# Patient Record
Sex: Female | Born: 2000 | Race: Black or African American | Hispanic: No | Marital: Single | Smoking: Former smoker
Health system: Southern US, Community
[De-identification: ages and names within clinical notes are randomized; demographics above are authoritative.]

## PROBLEM LIST (undated history)

## (undated) DIAGNOSIS — J45909 Unspecified asthma, uncomplicated: Secondary | ICD-10-CM

## (undated) DIAGNOSIS — F419 Anxiety disorder, unspecified: Secondary | ICD-10-CM

## (undated) HISTORY — PX: TONSILLECTOMY: SUR1361

## (undated) HISTORY — PX: ADENOIDECTOMY: SUR15

---
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
## 2021-05-16 ENCOUNTER — Other Ambulatory Visit: Payer: Self-pay

## 2021-05-16 ENCOUNTER — Emergency Department (HOSPITAL_COMMUNITY)
Admission: EM | Admit: 2021-05-16 | Discharge: 2021-05-16 | Disposition: A | Discharge status: Home / Self Care | Payer: Medicaid Other | Attending: Emergency Medicine | Admitting: Emergency Medicine

## 2021-05-16 ENCOUNTER — Emergency Department (HOSPITAL_COMMUNITY): Payer: Medicaid Other

## 2021-05-16 ENCOUNTER — Encounter (HOSPITAL_COMMUNITY): Payer: Self-pay

## 2021-05-16 DIAGNOSIS — S299XXA Unspecified injury of thorax, initial encounter: Secondary | ICD-10-CM

## 2021-05-16 DIAGNOSIS — S20212A Contusion of left front wall of thorax, initial encounter: Secondary | ICD-10-CM | POA: Insufficient documentation

## 2021-05-16 DIAGNOSIS — Y92410 Unspecified street and highway as the place of occurrence of the external cause: Secondary | ICD-10-CM | POA: Diagnosis not present

## 2021-05-16 DIAGNOSIS — J45909 Unspecified asthma, uncomplicated: Secondary | ICD-10-CM | POA: Insufficient documentation

## 2021-05-16 DIAGNOSIS — R1030 Lower abdominal pain, unspecified: Secondary | ICD-10-CM | POA: Diagnosis not present

## 2021-05-16 DIAGNOSIS — Z7951 Long term (current) use of inhaled steroids: Secondary | ICD-10-CM | POA: Insufficient documentation

## 2021-05-16 LAB — I-STAT CHEM 8, ED
BUN: 5 mg/dL — ABNORMAL LOW (ref 6–20)
Calcium, Ion: 1.19 mmol/L (ref 1.15–1.40)
Chloride: 103 mmol/L (ref 98–111)
Creatinine, Ser: 0.6 mg/dL (ref 0.44–1.00)
Glucose, Bld: 91 mg/dL (ref 70–99)
HCT: 37 % (ref 36.0–46.0)
Hemoglobin: 12.6 g/dL (ref 12.0–15.0)
Potassium: 3.8 mmol/L (ref 3.5–5.1)
Sodium: 138 mmol/L (ref 135–145)
TCO2: 28 mmol/L (ref 22–32)

## 2021-05-16 LAB — BASIC METABOLIC PANEL
Anion gap: 10 (ref 5–15)
BUN: 6 mg/dL (ref 6–20)
CO2: 23 mmol/L (ref 22–32)
Calcium: 9.5 mg/dL (ref 8.9–10.3)
Chloride: 102 mmol/L (ref 98–111)
Creatinine, Ser: 0.75 mg/dL (ref 0.44–1.00)
GFR, Estimated: >60 mL/min (ref >60)
Glucose, Bld: 94 mg/dL (ref 70–99)
Potassium: 3.6 mmol/L (ref 3.5–5.1)
Sodium: 135 mmol/L (ref 135–145)

## 2021-05-16 LAB — TYPE AND SCREEN
ABO/RH(D): O POS
Antibody Screen: NEGATIVE

## 2021-05-16 LAB — CBC WITH DIFFERENTIAL/PLATELET
Abs Immature Granulocytes: 0.28 10*3/uL — ABNORMAL HIGH (ref 0.00–0.07)
Basophils Absolute: 0 10*3/uL (ref 0.0–0.1)
Basophils Relative: 0 %
Eosinophils Absolute: 0 10*3/uL (ref 0.0–0.5)
Eosinophils Relative: 0 %
HCT: 34.9 % — ABNORMAL LOW (ref 36.0–46.0)
Hemoglobin: 11.9 g/dL — ABNORMAL LOW (ref 12.0–15.0)
Immature Granulocytes: 2 %
Lymphocytes Relative: 8 %
Lymphs Abs: 1.3 10*3/uL (ref 0.7–4.0)
MCH: 31.1 pg (ref 26.0–34.0)
MCHC: 34.1 g/dL (ref 30.0–36.0)
MCV: 91.1 fL (ref 80.0–100.0)
Monocytes Absolute: 1.1 10*3/uL — ABNORMAL HIGH (ref 0.1–1.0)
Monocytes Relative: 7 %
Neutro Abs: 13.4 10*3/uL — ABNORMAL HIGH (ref 1.7–7.7)
Neutrophils Relative %: 83 %
Platelets: 445 10*3/uL — ABNORMAL HIGH (ref 150–400)
RBC: 3.83 MIL/uL — ABNORMAL LOW (ref 3.87–5.11)
RDW: 12.8 % (ref 11.5–15.5)
WBC: 16.1 10*3/uL — ABNORMAL HIGH (ref 4.0–10.5)
nRBC: 0 % (ref 0.0–0.2)

## 2021-05-16 LAB — I-STAT BETA HCG BLOOD, ED (MC, WL, AP ONLY): I-stat hCG, quantitative: 9.7 m[IU]/mL — ABNORMAL HIGH (ref <5)

## 2021-05-16 MED ORDER — SODIUM CHLORIDE 0.9 % IV BOLUS: 1000.0000 mL | Freq: Once | INTRAVENOUS | Status: DC

## 2021-05-16 MED ORDER — IOHEXOL 300 MG/ML  SOLN
100.0000 mL | Freq: Once | INTRAMUSCULAR | Status: AC | PRN
  Administered 2021-05-16: 100 mL via INTRAVENOUS

## 2021-05-16 MED ORDER — MORPHINE SULFATE (PF) 2 MG/ML IV SOLN
2.0000 mg | Freq: Once | INTRAVENOUS | Status: AC
  Administered 2021-05-16: 2 mg via INTRAVENOUS
  Filled 2021-05-16: qty 1

## 2021-05-16 MED ORDER — SODIUM CHLORIDE 0.9 % IV BOLUS
1000.0000 mL | Freq: Once | INTRAVENOUS | Status: AC
  Administered 2021-05-16: 1000 mL via INTRAVENOUS
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
## 2021-05-16 NOTE — Discharge Instructions (Addendum)
Return for any problem - especially increased pain, vomiting, or other emergency.  Take ibupofen - 600 mg every 8 hours - for pain.
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
## 2021-05-16 NOTE — ED Notes (Signed)
Discharge instructions reviewed with patient and family. Patient and family verbalized understanding of instructions. Follow-up care and medications were reviewed. Patient wheeled out of ED in wheelchair. VSS upon discharge.  °

## 2021-06-11 NOTE — L&D Delivery Note (Signed)
OB/GYN Faculty Practice Delivery Note  Debbie Schaefer is a 21 y.o. G1P0 s/p SVD at [redacted]w[redacted]d. She was admitted for SOL.   ROM: Uknown possibly in MAU. Clear fluid at last cervical exam when complete GBS Status: Positive, Vanc 2/2 PNC rxn   Maximum Maternal Temperature: 98.3  Labor Progress: Initial SVE: 5/80/-2. She then progressed to complete.   Delivery Date/Time: 05/04/2022 @0155  Delivery: Called to room and patient was complete and pushing. Head delivered OA. No nuchal cord present. Shoulder and body delivered in usual fashion. Infant with spontaneous cry, placed on mother's abdomen, dried and stimulated. Cord clamped x 2 after 1-minute delay, and cut by FOB. Cord blood drawn. Placenta delivered spontaneously with gentle cord traction. Fundus firm with massage and Pitocin. Labia, perineum, vagina, and cervix inspected with left perineal sulcus tear and left skin abrasion repaired with 3.0 vicryl and 4.0 Monocryl respectively.  Baby Weight: pending  Placenta: 3 vessel, intact. Sent to L&D Complications: None Lacerations: left perineal sulcus tear and left skin abrasion repaired with 3.0 vicryl and 4.0 Monocryl respectively.  EBL: 47 mL Analgesia: Epidural   Infant:  APGAR (1 MIN): 7   APGAR (5 MINS): 8    , DO OB Fellow, Faculty Lavonda Jumbo, [facility] 05/04/2022, 2:26 AM

## 2021-06-26 ENCOUNTER — Emergency Department (HOSPITAL_COMMUNITY)
Admission: EM | Admit: 2021-06-26 | Discharge: 2021-06-26 | Disposition: A | Discharge status: Home / Self Care | Payer: Medicaid Other | Attending: Emergency Medicine | Admitting: Emergency Medicine

## 2021-06-26 ENCOUNTER — Encounter (HOSPITAL_COMMUNITY): Payer: Self-pay

## 2021-06-26 DIAGNOSIS — Z3A09 9 weeks gestation of pregnancy: Secondary | ICD-10-CM | POA: Diagnosis not present

## 2021-06-26 DIAGNOSIS — O200 Threatened abortion: Secondary | ICD-10-CM | POA: Diagnosis not present

## 2021-06-26 DIAGNOSIS — O4691 Antepartum hemorrhage, unspecified, first trimester: Secondary | ICD-10-CM | POA: Diagnosis present

## 2021-06-26 LAB — CBC
HCT: 33.6 % — ABNORMAL LOW (ref 36.0–46.0)
Hemoglobin: 11.3 g/dL — ABNORMAL LOW (ref 12.0–15.0)
MCH: 30.7 pg (ref 26.0–34.0)
MCHC: 33.6 g/dL (ref 30.0–36.0)
MCV: 91.3 fL (ref 80.0–100.0)
Platelets: 312 10*3/uL (ref 150–400)
RBC: 3.68 MIL/uL — ABNORMAL LOW (ref 3.87–5.11)
RDW: 13.2 % (ref 11.5–15.5)
WBC: 6.4 10*3/uL (ref 4.0–10.5)
nRBC: 0 % (ref 0.0–0.2)

## 2021-06-26 LAB — BASIC METABOLIC PANEL
Anion gap: 6 (ref 5–15)
BUN: 14 mg/dL (ref 6–20)
CO2: 24 mmol/L (ref 22–32)
Calcium: 8.9 mg/dL (ref 8.9–10.3)
Chloride: 107 mmol/L (ref 98–111)
Creatinine, Ser: 0.65 mg/dL (ref 0.44–1.00)
GFR, Estimated: >60 mL/min (ref >60)
Glucose, Bld: 97 mg/dL (ref 70–99)
Potassium: 3.7 mmol/L (ref 3.5–5.1)
Sodium: 137 mmol/L (ref 135–145)

## 2021-06-26 LAB — I-STAT BETA HCG BLOOD, ED (MC, WL, AP ONLY): I-stat hCG, quantitative: 25.3 m[IU]/mL — ABNORMAL HIGH (ref <5)

## 2021-06-26 LAB — HCG, QUANTITATIVE, PREGNANCY: hCG, Beta Chain, Quant, S: 19 m[IU]/mL — ABNORMAL HIGH (ref <5)

## 2021-06-26 NOTE — ED Provider Notes (Signed)
[facility] Provider Note   CSN: [id number] Arrival date & time: 06/26/21  1142     History  Chief Complaint  Patient presents with   Vaginal Bleeding    Debbie Schaefer is a 21 y.o. female.  She is a G1P0, 0 positive whose last menstrual period was 11/9.  She had 2 positive home pregnancy test.  Today she had some low abdominal cramping and noticed some dark blood.  Pain is mostly resolved and is having minimal bleeding.  She has a appointment with her OB in 2 days.  The history is provided by the patient.  Vaginal Bleeding Quality:  Dark red Severity:  Mild Onset quality:  Gradual Duration:  8 hours Timing:  Intermittent Progression:  Improving Chronicity:  New Possible pregnancy: yes   Context: spontaneously   Relieved by:  None tried Worsened by:  Nothing Ineffective treatments:  None tried Associated symptoms: abdominal pain   Associated symptoms: no back pain, no dysuria, no fever and no nausea   Risk factors: no prior miscarriage       Home Medications Prior to Admission medications   Medication Sig Start Date End Date Taking? Authorizing Provider  Biotin 10 MG CHEW Chew 2 tablets by mouth daily.    [provider]  cephALEXin (KEFLEX) 500 MG capsule Take 1 capsule (500 mg total) by mouth 3 (three) times daily. 04/27/20   Terrilee Files, MD  escitalopram (LEXAPRO) 10 MG tablet Take 10 mg by mouth daily. 05/15/21   [provider]  Norgestimate-Ethinyl Estradiol Triphasic 0.18/0.215/0.25 MG-35 MCG tablet Take 1 tablet by mouth daily.    [provider]  ondansetron (ZOFRAN ODT) 4 MG disintegrating tablet Take 1 tablet (4 mg total) by mouth every 8 (eight) hours as needed for nausea or vomiting. Patient not taking: Reported on 05/16/2021 04/27/20   Terrilee Files, MD      Allergies    Amoxicillin and Azithromycin    Review of Systems   Review of Systems  Constitutional:  Negative for fever.   HENT:  Negative for sore throat.   Respiratory:  Negative for shortness of breath.   Cardiovascular:  Negative for chest pain.  Gastrointestinal:  Positive for abdominal pain. Negative for nausea.  Genitourinary:  Positive for vaginal bleeding. Negative for dysuria.  Musculoskeletal:  Negative for back pain.  Skin:  Negative for rash.   Physical Exam Updated Vital Signs BP 120/67    Pulse 71    Temp 98.2 F (36.8 C) (Oral)    Resp 16    LMP 04/19/2021 (Exact Date)    SpO2 100%  Physical Exam Vitals and nursing note reviewed.  Constitutional:      General: She is not in acute distress.    Appearance: Normal appearance. She is well-developed.  HENT:     Head: Normocephalic and atraumatic.  Eyes:     Conjunctiva/sclera: Conjunctivae normal.  Cardiovascular:     Rate and Rhythm: Normal rate and regular rhythm.     Heart sounds: No murmur heard. Pulmonary:     Effort: Pulmonary effort is normal. No respiratory distress.     Breath sounds: Normal breath sounds.  Abdominal:     Palpations: Abdomen is soft.     Tenderness: There is no abdominal tenderness. There is no guarding or rebound.  Musculoskeletal:        General: No swelling.     Cervical back: Neck supple.  Skin:    General: Skin  is warm and dry.     Capillary Refill: Capillary refill takes less than 2 seconds.  Neurological:     General: No focal deficit present.     Mental Status: She is alert.    ED Results / Procedures / Treatments   Labs (all labs ordered are listed, but only abnormal results are displayed) Labs Reviewed  HCG, QUANTITATIVE, PREGNANCY - Abnormal; Notable for the following components:      Result Value   hCG, Beta Chain, Quant, S 19 (*)    All other components within normal limits  CBC - Abnormal; Notable for the following components:   RBC 3.68 (*)    Hemoglobin 11.3 (*)    HCT 33.6 (*)    All other components within normal limits  I-STAT BETA HCG BLOOD, ED ([facility], [facility], [facility] ONLY) - Abnormal;  Notable for the following components:   I-stat hCG, quantitative 25.3 (*)    All other components within normal limits  BASIC METABOLIC PANEL    EKG None  Radiology No results found.  Procedures Procedures    Medications Ordered in ED Medications - No data to display  ED Course/ Medical Decision Making/ A&P Clinical Course as of 06/27/21 1053  Mon Jun 26, 2021  1744 HCG, Beta Chain, Quant, S(!): 19 [JL]  1817 Reviewed the results of the work-up with the patient and the expected course.  She has an appointment with OB in 2 days where they can repeat her quant.  She declines pelvic exam at this time. [MB]    Clinical Course User Index [JL] Ernie Avena, MD [MB] Terrilee Files, MD                           Medical Decision Making  This patient complains of vaginal bleeding and lower abdominal cramping in the setting of early pregnancy; this involves an extensive number of treatment Options and is a complaint that carries with it a high risk of complications and Morbidity. The differential includes miscarriage, ectopic, UTI, pregnancy  I ordered, reviewed and interpreted labs, which included CBC with normal white count, hemoglobin low stable from priors, chemistries normal, beta quant low for dates  Additional history obtained from patient significant other Previous records obtained and reviewed in epic no recent admissions, is  O positive blood type  After the interventions stated above, I reevaluated the patient and found patient to be hemodynamically stable.  Reviewed results of work-up with her.  She understands she needs OB follow-up for serial quant and ultrasound.  Return instructions discussed         Final Clinical Impression(s) / ED Diagnoses Final diagnoses:  Threatened miscarriage    Rx / DC Orders ED Discharge Orders     None         Terrilee Files, MD 06/27/21 1055

## 2021-06-26 NOTE — ED Provider Triage Note (Signed)
Emergency Medicine Provider Triage Evaluation Note  Debbie Schaefer , a 21 y.o. female  was evaluated in triage.  Pt complains of abdominal cramping that occurred this morning lasted 15 minutes and then resolved and since then she has had a dark brown discharge that has persisted.  She denies back pain, abdominal pain.  Without other complaints.  Reports she took a home pregnancy test Tuesday and was positive.  Last menstrual period November 9..  Review of Systems  Positive: As above Negative: As above  Physical Exam  BP 127/80 (BP Location: Left Arm)    Pulse 78    Temp 98.2 F (36.8 C) (Oral)    Resp 16    LMP 04/19/2021 (Exact Date)    SpO2 100%  Gen:   Awake, no distress   Resp:  Normal effort  MSK:   Moves extremities without difficulty  Other:    Medical Decision Making  Medically screening exam initiated at 12:38 PM.  Appropriate orders placed.  Debbie Schaefer was informed that the remainder of the evaluation will be completed by another provider, this initial triage assessment does not replace that evaluation, and the importance of remaining in the ED until their evaluation is complete.     Marita [place], PA-C 06/26/21 1239

## 2021-06-26 NOTE — ED Notes (Signed)
Pelvic cart at bedside. 

## 2021-06-26 NOTE — Discharge Instructions (Addendum)
You were seen in the emergency department for lower abdominal crampy pain and bleeding in the setting of an early pregnancy.  Your pregnancy number is 19, which does not fit with a 6 to 8-week pregnancy.  This may represent an early pregnancy and your dates are off, or this may be indicating a spontaneous miscarriage.  It would be important for you to follow-up with your OB so they can repeat lab work and possibly do an ultrasound.  You can use Tylenol for pain.  Return to the emergency department if any worsening or concerning symptoms.

## 2021-06-26 NOTE — ED Triage Notes (Signed)
Pt reports she did an at home preg test and was positive last Tuesday.   Pt c/o lower abd cramping with onset this morning. Pt reports 2 hours later pt started bleeding dark brown spotting about 1/4 of the pad.   LMP-November 9th   A/Ox4 Ambulatory in triage

## 2021-09-21 LAB — OB RESULTS CONSOLE RUBELLA ANTIBODY, IGM: Rubella: IMMUNE

## 2022-02-13 LAB — OB RESULTS CONSOLE HIV ANTIBODY (ROUTINE TESTING): HIV: NONREACTIVE

## 2022-04-04 ENCOUNTER — Other Ambulatory Visit: Payer: Self-pay

## 2022-04-04 ENCOUNTER — Inpatient Hospital Stay (HOSPITAL_BASED_OUTPATIENT_CLINIC_OR_DEPARTMENT_OTHER): Payer: Medicaid Other

## 2022-04-04 ENCOUNTER — Encounter (HOSPITAL_COMMUNITY): Payer: Self-pay | Admitting: Obstetrics and Gynecology

## 2022-04-04 ENCOUNTER — Inpatient Hospital Stay (HOSPITAL_COMMUNITY)
Admission: AD | Admit: 2022-04-04 | Discharge: 2022-04-04 | Disposition: A | Discharge status: Home / Self Care | Payer: Medicaid Other | Attending: Obstetrics and Gynecology | Admitting: Obstetrics and Gynecology

## 2022-04-04 DIAGNOSIS — W010XXA Fall on same level from slipping, tripping and stumbling without subsequent striking against object, initial encounter: Secondary | ICD-10-CM | POA: Insufficient documentation

## 2022-04-04 DIAGNOSIS — Z3A34 34 weeks gestation of pregnancy: Secondary | ICD-10-CM | POA: Insufficient documentation

## 2022-04-04 DIAGNOSIS — X58XXXA Exposure to other specified factors, initial encounter: Secondary | ICD-10-CM | POA: Insufficient documentation

## 2022-04-04 DIAGNOSIS — W19XXXA Unspecified fall, initial encounter: Secondary | ICD-10-CM | POA: Diagnosis not present

## 2022-04-04 DIAGNOSIS — O9A213 Injury, poisoning and certain other consequences of external causes complicating pregnancy, third trimester: Secondary | ICD-10-CM | POA: Diagnosis not present

## 2022-04-04 DIAGNOSIS — O26893 Other specified pregnancy related conditions, third trimester: Secondary | ICD-10-CM | POA: Insufficient documentation

## 2022-04-04 LAB — OB RESULTS CONSOLE GC/CHLAMYDIA: Neisseria Gonorrhea: NEGATIVE

## 2022-04-04 LAB — OB RESULTS CONSOLE HEPATITIS B SURFACE ANTIGEN: Hepatitis B Surface Ag: NEGATIVE

## 2022-04-04 LAB — OB RESULTS CONSOLE RPR: RPR: NONREACTIVE

## 2022-04-04 NOTE — MAU Provider Note (Cosign Needed)
History     CSN: [id number]  Arrival date and time: 04/04/22 1835  None     Chief Complaint  Patient presents with   Fall   HPI Debbie Schaefer is a 21 y.o. G1P0 at [redacted]w[redacted]d who presents to MAU for fall. Patient reports that around 6pm while she was at work, she stood up from her chair to answer the phone when her legs got tangled in her chair. She reports she lost balance and fell directly onto her abdomen. She did not hit her head. She reports feeling some sharp/stabbing twinges in lower abdomen intermittently. She denies vaginal bleeding or leaking fluid. She reports she felt one big kick from baby about 15 minutes prior however movements have been decreased since the fall occurred.   Patient receives [facility] at [facility] in [place]. She reports this has been an uncomplicated pregnancy. Next appointment is scheduled on 10/31.   OB History     Gravida  1   Para      Term      Preterm      AB      Living         SAB      IAB      Ectopic      Multiple      Live Births              Past Medical History:  Diagnosis Date   Anxiety    Asthma     Past Surgical History:  Procedure Laterality Date   ADENOIDECTOMY     TONSILLECTOMY      History reviewed. No pertinent family history.  Social History   Tobacco Use   Smoking status: Former    Types: Cigarettes   Smokeless tobacco: Never  Substance Use Topics   Alcohol use: Not Currently   Drug use: Not Currently    Allergies:  Allergies  Allergen Reactions   Amoxicillin Rash   Azithromycin Rash    Medications Prior to Admission  Medication Sig Dispense Refill Last Dose   Prenatal Vit-Fe Fumarate-FA (MULTIVITAMIN-PRENATAL) 27-0.8 MG TABS tablet Take 1 tablet by mouth daily at 12 noon.   04/04/2022   Biotin 10 MG CHEW Chew 2 tablets by mouth daily.      cephALEXin (KEFLEX) 500 MG capsule Take 1 capsule (500 mg total) by mouth 3 (three) times daily. 21 capsule 0    escitalopram (LEXAPRO) 10 MG  tablet Take 10 mg by mouth daily.      Norgestimate-Ethinyl Estradiol Triphasic 0.18/0.215/0.25 MG-35 MCG tablet Take 1 tablet by mouth daily.      ondansetron (ZOFRAN ODT) 4 MG disintegrating tablet Take 1 tablet (4 mg total) by mouth every 8 (eight) hours as needed for nausea or vomiting. (Patient not taking: Reported on 05/16/2021) 20 tablet 0    Review of Systems  Constitutional: Negative.   Respiratory: Negative.    Cardiovascular: Negative.   Gastrointestinal:  Positive for abdominal pain.  Genitourinary: Negative.   Musculoskeletal: Negative.   Neurological: Negative.    Physical Exam   Blood pressure 132/81, pulse (!) 101, temperature 98.4 F (36.9 C), temperature source Oral, resp. rate 18, height 5\' 7"  (1.702 m), weight 95.3 kg, last menstrual period 04/19/2021, SpO2 100 %.  Physical Exam Vitals and nursing note reviewed.  Constitutional:      General: She is not in acute distress. Eyes:     Extraocular Movements: Extraocular movements intact.     Pupils: Pupils are equal,  round, and reactive to light.  Cardiovascular:     Rate and Rhythm: Tachycardia present.  Pulmonary:     Effort: Pulmonary effort is normal.  Abdominal:     Palpations: Abdomen is soft.     Tenderness: There is no abdominal tenderness. There is no guarding or rebound.     Comments: gravid  Musculoskeletal:        General: Normal range of motion.     Cervical back: Normal range of motion.  Skin:    General: Skin is warm and dry.  Neurological:     General: No focal deficit present.     Mental Status: She is alert and oriented to person, place, and time.  Psychiatric:        Mood and Affect: Mood normal.        Behavior: Behavior normal.   NST FHR: 125 bpm, moderate variability, +15x15 accels, no decels Toco: occ ui       MAU Course  Procedures  MDM 4 hours CEFM Ultrasound  NST clicker given and reports feeling a lot of movement now Preliminary ultrasound report without evidence  of abruption, AFI wnl. 2100: Patient reports continued fetal movement. "Twinges" have improved, feeling occasionally.  Will continue extended monitoring for at least 4 hours. NST reassuring thus far, toco with occ ui, but no regular contractions.   Care handed over to [facility] Jimmye Norman, CNM at 2100   Renee Harder, [place] 04/04/22 9:03 PM  Over 4 hours of monitoring completed FHR has remained reactive and reassuring Intermittent uterine irritability noted Reviewed with Dr Damita Dunnings who recommends discharge home Reviewed fetal movement monitoring WIll give work note for rest tomorrow  Assessment and Plan  A:  Single IUP at [redacted]w[redacted]d       S/P fall        Reassuring fetal monitor tracing  P:   Discharge home        Fetal movement monitoring        Work note given      Encouraged to return if she develops worsening of symptoms, increase in pain, fever, or other concerning symptoms.   Seabron Spates, CNM

## 2022-04-04 NOTE — MAU Note (Signed)
Debbie Schaefer is a 21 y.o. at [redacted]w[redacted]d here in MAU reporting: she fell onto her abdomen while @ work approximately 1 hour ago.  Denies VB or LOF.  Reports no FM since fall.  States feeling intermittent twinges of pain in abdomen LMP: N/A Onset of complaint: today Pain score: 6 Vitals:   04/04/22 1906  BP: 138/72  Pulse: 93  Resp: 18  Temp: 98.4 F (36.9 C)  SpO2: 100%     FHT:156 bpm Lab orders placed from triage:   None

## 2022-04-24 LAB — OB RESULTS CONSOLE GC/CHLAMYDIA
Chlamydia: NEGATIVE
Neisseria Gonorrhea: NEGATIVE

## 2022-05-03 ENCOUNTER — Inpatient Hospital Stay (HOSPITAL_COMMUNITY): Payer: Medicaid Other | Admitting: Anesthesiology

## 2022-05-03 ENCOUNTER — Encounter (HOSPITAL_COMMUNITY): Payer: Self-pay | Admitting: Obstetrics and Gynecology

## 2022-05-03 ENCOUNTER — Inpatient Hospital Stay (HOSPITAL_COMMUNITY)
Admission: AD | Admit: 2022-05-03 | Discharge: 2022-05-05 | DRG: 807 | Disposition: A | Discharge status: Home / Self Care | Payer: Medicaid Other | Attending: Obstetrics and Gynecology | Admitting: Obstetrics and Gynecology

## 2022-05-03 ENCOUNTER — Other Ambulatory Visit: Payer: Self-pay

## 2022-05-03 DIAGNOSIS — O9952 Diseases of the respiratory system complicating childbirth: Secondary | ICD-10-CM | POA: Diagnosis present

## 2022-05-03 DIAGNOSIS — F319 Bipolar disorder, unspecified: Secondary | ICD-10-CM | POA: Diagnosis present

## 2022-05-03 DIAGNOSIS — J45909 Unspecified asthma, uncomplicated: Secondary | ICD-10-CM | POA: Diagnosis present

## 2022-05-03 DIAGNOSIS — O99344 Other mental disorders complicating childbirth: Secondary | ICD-10-CM | POA: Diagnosis present

## 2022-05-03 DIAGNOSIS — Z87891 Personal history of nicotine dependence: Secondary | ICD-10-CM | POA: Diagnosis not present

## 2022-05-03 DIAGNOSIS — O99824 Streptococcus B carrier state complicating childbirth: Principal | ICD-10-CM | POA: Diagnosis present

## 2022-05-03 DIAGNOSIS — O99820 Streptococcus B carrier state complicating pregnancy: Secondary | ICD-10-CM | POA: Diagnosis not present

## 2022-05-03 DIAGNOSIS — Z880 Allergy status to penicillin: Secondary | ICD-10-CM

## 2022-05-03 DIAGNOSIS — Z3A38 38 weeks gestation of pregnancy: Secondary | ICD-10-CM

## 2022-05-03 DIAGNOSIS — O99214 Obesity complicating childbirth: Secondary | ICD-10-CM | POA: Diagnosis present

## 2022-05-03 DIAGNOSIS — O26893 Other specified pregnancy related conditions, third trimester: Secondary | ICD-10-CM | POA: Diagnosis present

## 2022-05-03 LAB — CBC
HCT: 41.8 % (ref 36.0–46.0)
Hemoglobin: 14.6 g/dL (ref 12.0–15.0)
MCH: 31.4 pg (ref 26.0–34.0)
MCHC: 34.9 g/dL (ref 30.0–36.0)
MCV: 89.9 fL (ref 80.0–100.0)
Platelets: 330 10*3/uL (ref 150–400)
RBC: 4.65 MIL/uL (ref 3.87–5.11)
RDW: 13 % (ref 11.5–15.5)
WBC: 15.7 10*3/uL — ABNORMAL HIGH (ref 4.0–10.5)
nRBC: 0 % (ref 0.0–0.2)

## 2022-05-03 LAB — TYPE AND SCREEN
ABO/RH(D): O POS
Antibody Screen: NEGATIVE

## 2022-05-03 MED ORDER — PHENYLEPHRINE 80 MCG/ML (10ML) SYRINGE FOR IV PUSH (FOR BLOOD PRESSURE SUPPORT): 80.0000 ug | PREFILLED_SYRINGE | INTRAVENOUS | Status: DC | PRN

## 2022-05-03 MED ORDER — EPHEDRINE 5 MG/ML INJ: 10.0000 mg | INTRAVENOUS | Status: DC | PRN

## 2022-05-03 MED ORDER — OXYTOCIN-SODIUM CHLORIDE 30-0.9 UT/500ML-% IV SOLN
2.5000 [IU]/h | INTRAVENOUS | Status: DC
  Administered 2022-05-04: 2.5 [IU]/h via INTRAVENOUS
  Filled 2022-05-03: qty 500

## 2022-05-03 MED ORDER — OXYTOCIN BOLUS FROM INFUSION
333.0000 mL | Freq: Once | INTRAVENOUS | Status: AC
  Administered 2022-05-04: 333 mL via INTRAVENOUS

## 2022-05-03 MED ORDER — FENTANYL-BUPIVACAINE-NACL 0.5-0.125-0.9 MG/250ML-% EP SOLN
EPIDURAL | Status: DC | PRN
  Administered 2022-05-03: 12 mL/h via EPIDURAL

## 2022-05-03 MED ORDER — DIPHENHYDRAMINE HCL 50 MG/ML IJ SOLN: 12.5000 mg | INTRAMUSCULAR | Status: DC | PRN

## 2022-05-03 MED ORDER — SOD CITRATE-CITRIC ACID 500-334 MG/5ML PO SOLN: 30.0000 mL | ORAL | Status: DC | PRN

## 2022-05-03 MED ORDER — LACTATED RINGERS IV SOLN: INTRAVENOUS | Status: DC

## 2022-05-03 MED ORDER — LIDOCAINE HCL (PF) 1 % IJ SOLN
INTRAMUSCULAR | Status: DC | PRN
  Administered 2022-05-03: 10 mL via EPIDURAL
  Administered 2022-05-03: 2 mL via EPIDURAL

## 2022-05-03 MED ORDER — FENTANYL-BUPIVACAINE-NACL 0.5-0.125-0.9 MG/250ML-% EP SOLN
12.0000 mL/h | EPIDURAL | Status: DC | PRN
  Filled 2022-05-03: qty 250

## 2022-05-03 MED ORDER — LACTATED RINGERS IV SOLN: 500.0000 mL | INTRAVENOUS | Status: DC | PRN

## 2022-05-03 MED ORDER — LACTATED RINGERS IV SOLN
500.0000 mL | Freq: Once | INTRAVENOUS | Status: AC
  Administered 2022-05-03: 500 mL via INTRAVENOUS

## 2022-05-03 MED ORDER — ONDANSETRON HCL 4 MG/2ML IJ SOLN: 4.0000 mg | Freq: Four times a day (QID) | INTRAMUSCULAR | Status: DC | PRN

## 2022-05-03 MED ORDER — LIDOCAINE HCL (PF) 1 % IJ SOLN: 30.0000 mL | INTRAMUSCULAR | Status: DC | PRN

## 2022-05-03 MED ORDER — ACETAMINOPHEN 325 MG PO TABS: 650.0000 mg | ORAL_TABLET | ORAL | Status: DC | PRN

## 2022-05-03 MED ORDER — VANCOMYCIN HCL 10 G IV SOLR
2000.0000 mg | Freq: Three times a day (TID) | INTRAVENOUS | Status: DC
  Administered 2022-05-03: 2000 mg via INTRAVENOUS
  Filled 2022-05-03 (×2): qty 20
  Filled 2022-05-03: qty 2000

## 2022-05-03 NOTE — H&P (Addendum)
OBSTETRIC ADMISSION HISTORY AND PHYSICAL  Debbie Schaefer is a 21 y.o. female G1P0 with IUP at [redacted]w[redacted]d by clinical EDD presenting for SOL. She reports +FMs, No LOF, no VB, no blurry vision, headaches or peripheral edema, and RUQ pain.  She plans on breast and bottle feeding. She is unsure of what she wants for birth control. She received her prenatal care at [facility].    Dating: By Early US--->  Estimated Date of Delivery: 05/13/22  Sono:    Limited US  @[redacted]w[redacted]d , CWD, normal anatomy, cephalic presentation, fundal placental lie   Prenatal History/Complications:  -Anemia -GBS+ -Hx of Overdose, 4 years prior -MDD, bipolar disorder taking vraylar  Past Medical History: Past Medical History:  Diagnosis Date   Anxiety    Asthma     Past Surgical History: Past Surgical History:  Procedure Laterality Date   ADENOIDECTOMY     TONSILLECTOMY      Obstetrical History: OB History     Gravida  1   Para      Term      Preterm      AB      Living         SAB      IAB      Ectopic      Multiple      Live Births              Social History Social History   Socioeconomic History   Marital status: Single    Spouse name: Not on file   Number of children: Not on file   Years of education: Not on file   Highest education level: Not on file  Occupational History   Not on file  Tobacco Use   Smoking status: Former    Types: Cigarettes   Smokeless tobacco: Never  Substance and Sexual Activity   Alcohol use: Not Currently   Drug use: Not Currently   Sexual activity: Not Currently  Other Topics Concern   Not on file  Social History Narrative   ** Merged History Encounter **       Social Determinants of Health   Financial Resource Strain: Not on file  Food Insecurity: Not on file  Transportation Needs: Not on file  Physical Activity: Not on file  Stress: Not on file  Social Connections: Not on file    Family History: History reviewed. No pertinent  family history.  Allergies: Allergies  Allergen Reactions   Amoxicillin Rash   Azithromycin Rash   Penicillins Rash and Other (See Comments)    Medications Prior to Admission  Medication Sig Dispense Refill Last Dose   Prenatal Vit-Fe Fumarate-FA (MULTIVITAMIN-PRENATAL) 27-0.8 MG TABS tablet Take 1 tablet by mouth daily at 12 noon.   05/03/2022   Biotin 10 MG CHEW Chew 2 tablets by mouth daily.      escitalopram (LEXAPRO) 10 MG tablet Take 10 mg by mouth daily.        Review of Systems   All systems reviewed and negative except as stated in HPI  Blood pressure (!) 118/58, pulse 94, temperature 98.1 F (36.7 C), temperature source Oral, resp. rate (!) 22, height 5\' 7"  (1.702 m), weight 97 kg, last menstrual period 04/19/2021, SpO2 100 %. General appearance: alert and moderate distress Lungs: normal effort Heart: regular rate noted Abdomen: gravid Pelvic: Deferred.  Extremities: No LE edema Presentation:  reported cephalic Fetal monitoringBaseline: 125 bpm, Variability: Good {> 6 bpm), Accelerations: Reactive, and Decelerations: Absent Uterine  activityFrequency: Every 1-2 minutes Dilation: 5 Effacement (%): 80 Station: -2 Exam by:: Georgina Snell, RN   Prenatal labs: ABO, Rh: --/--/O POS (12/06 2050) Antibody: NEG (12/06 2050) Rubella:  Imm RPR:   NR HBsAg:   Neg HIV:  NR GBS:   Positive 1 hr Glucola 1hr GTT: failed, 3 hr passed  Genetic screening  not done Anatomy US wnl  Prenatal Transfer Tool  Maternal Diabetes: No Genetic Screening: n/a Maternal Ultrasounds/Referrals: Normal Fetal Ultrasounds or other Referrals:  None Maternal Substance Abuse:  No Significant Maternal Medications:  Meds include: Other: Vraylar Significant Maternal Lab Results:  Group B Strep positive Number of Prenatal Visits:greater than 3 verified prenatal visits Other Comments:  None  No results found for this or any previous visit (from the past 24 hour(s)).  Patient Active  Problem List   Diagnosis Date Noted   Indication for care in labor and delivery, antepartum 05/03/2022   Overdose 12/31/2017   MDD (major depressive disorder), recurrent severe, without psychosis (HCC) 12/24/2017    Assessment/Plan:  Debbie Schaefer is a 21 y.o. G1P0 at [redacted]w[redacted]d here for SOL.   #Labor: Expectant management #Pain: Plans for epidural #FWB: Cat I #ID:  GBS+ , vanc-PCN allergy #MOF: Breast #MOC: Unsure #Circ:  Yes   Adonijah Baena Autry-Lott, DO  05/03/2022, 9:34 PM

## 2022-05-03 NOTE — Anesthesia Procedure Notes (Signed)
Epidural Patient location during procedure: OB Start time: 05/03/2022 10:03 PM End time: 05/03/2022 10:10 PM  Staffing Anesthesiologist: Lannie Fields, DO Performed: anesthesiologist   Preanesthetic Checklist Completed: patient identified, IV checked, risks and benefits discussed, monitors and equipment checked, pre-op evaluation and timeout performed  Epidural Patient position: sitting Prep: DuraPrep and site prepped and draped Patient monitoring: continuous pulse ox, blood pressure, heart rate and cardiac monitor Approach: midline Location: L3-L4 Injection technique: LOR air  Needle:  Needle type: Tuohy  Needle gauge: 17 G Needle length: 9 cm Needle insertion depth: 7 cm Catheter type: closed end flexible Catheter size: 19 Gauge Catheter at skin depth: 12 cm Test dose: negative  Assessment Sensory level: T8 Events: blood not aspirated, injection not painful, no injection resistance, no paresthesia and negative IV test  Additional Notes Patient identified. Risks/Benefits/Options discussed with patient including but not limited to bleeding, infection, nerve damage, paralysis, failed block, incomplete pain control, headache, blood pressure changes, nausea, vomiting, reactions to medication both or allergic, itching and postpartum back pain. Confirmed with bedside nurse the patient's most recent platelet count. Confirmed with patient that they are not currently taking any anticoagulation, have any bleeding history or any family history of bleeding disorders. Patient expressed understanding and wished to proceed. All questions were answered. Sterile technique was used throughout the entire procedure. Please see nursing notes for vital signs. Test dose was given through epidural catheter and negative prior to continuing to dose epidural or start infusion. Warning signs of high block given to the patient including shortness of breath, tingling/numbness in hands, complete motor  block, or any concerning symptoms with instructions to call for help. Patient was given instructions on fall risk and not to get out of bed. All questions and concerns addressed with instructions to call with any issues or inadequate analgesia.  Reason for block:procedure for pain

## 2022-05-03 NOTE — Anesthesia Preprocedure Evaluation (Signed)
Anesthesia Evaluation  Patient identified by MRN, date of birth, ID band Patient awake    Reviewed: Allergy & Precautions, Patient's Chart, lab work & pertinent test results  Airway Mallampati: II  TM Distance: >3 FB Neck ROM: Full    Dental no notable dental hx.    Pulmonary asthma , former smoker   Pulmonary exam normal breath sounds clear to auscultation       Cardiovascular negative cardio ROS Normal cardiovascular exam Rhythm:Regular Rate:Normal     Neuro/Psych  PSYCHIATRIC DISORDERS Anxiety Depression    negative neurological ROS     GI/Hepatic negative GI ROS, Neg liver ROS,,,  Endo/Other  Obesity BMI 34  Renal/GU negative Renal ROS  negative genitourinary   Musculoskeletal negative musculoskeletal ROS (+)    Abdominal   Peds negative pediatric ROS (+)  Hematology negative hematology ROS (+)   Anesthesia Other Findings   Reproductive/Obstetrics (+) Pregnancy                             Anesthesia Physical Anesthesia Plan  ASA: 2  Anesthesia Plan: Epidural   Post-op Pain Management:    Induction:   PONV Risk Score and Plan: 2  Airway Management Planned: Natural Airway  Additional Equipment: None  Intra-op Plan:   Post-operative Plan:   Informed Consent: I have reviewed the patients History and Physical, chart, labs and discussed the procedure including the risks, benefits and alternatives for the proposed anesthesia with the patient or authorized representative who has indicated his/her understanding and acceptance.       Plan Discussed with:   Anesthesia Plan Comments:        Anesthesia Quick Evaluation

## 2022-05-03 NOTE — Progress Notes (Signed)
ANTIBIOTIC CONSULT NOTE  Pharmacy Consult for Vancomycin Indication: GBS Prophylaxis   Allergies  Allergen Reactions   Amoxicillin Rash   Azithromycin Rash   Penicillins Rash and Other (See Comments)    Patient Measurements: Height: 5\' 7"  (170.2 cm) Weight: 97 kg (213 lb 12.8 oz) IBW/kg (Calculated) : 61.6  Vital Signs: Temp: 98.3 F (36.8 C) (11/23 2141) Temp Source: Oral (11/23 2141) BP: 117/87 (11/23 2141) Pulse Rate: 118 (11/23 2141)  Labs: No results for input(s): "WBC", "HGB", "PLT", "LABCREA", "CREATININE", "CRCLEARANCE" in the last 72 hours. No results for input(s): "VANCOTROUGH", "VANCOPEAK", "VANCORANDOM" in the last 72 hours.   Microbiology: No results found for this or any previous visit (from the past 720 hour(s)).    Assessment: 21 y.o. female G1P0 at [redacted]w[redacted]d admitted for labor; Vancomycin for GBS px in patient with PCN allergy  Goal of Therapy:  Vancomycin Trough 15-20 mg/L  Plan:  Vancomycin 20 mg/kg (2000 mg) IV Q8Hr Ordered baseline SCr Check Scr with next labs if vancomycin continued beyond 48 hours. Will check vancomycin trough level prior to 4th or 5th dose.   [redacted]w[redacted]d 05/03/2022,9:51 PM

## 2022-05-03 NOTE — MAU Note (Signed)
.  Debbie Schaefer is a 21 y.o. at [redacted]w[redacted]d here in MAU reporting: ctx every 6-7 mins that are becoming more frequent since 1800. Pt reporting losing her mucus plug. Pt denies VB, LOF, DFM, PIH s/s, recent intercourse, and complications in the pregnancy. GBS pos SVE Tues 1cm Onset of complaint: 1800 Pain score: 10/10 Vitals:   05/03/22 1922  BP: 121/85  Pulse: 99  Resp: (!) 22  Temp: 98.1 F (36.7 C)  SpO2: 100%     FHT:132 Lab orders placed from triage:

## 2022-05-04 ENCOUNTER — Encounter (HOSPITAL_COMMUNITY): Payer: Self-pay | Admitting: Obstetrics and Gynecology

## 2022-05-04 DIAGNOSIS — O99820 Streptococcus B carrier state complicating pregnancy: Secondary | ICD-10-CM

## 2022-05-04 DIAGNOSIS — Z3A38 38 weeks gestation of pregnancy: Secondary | ICD-10-CM

## 2022-05-04 LAB — CBC
HCT: 36.7 % (ref 36.0–46.0)
Hemoglobin: 12.9 g/dL (ref 12.0–15.0)
MCH: 31.5 pg (ref 26.0–34.0)
MCHC: 35.1 g/dL (ref 30.0–36.0)
MCV: 89.5 fL (ref 80.0–100.0)
Platelets: 298 10*3/uL (ref 150–400)
RBC: 4.1 MIL/uL (ref 3.87–5.11)
RDW: 12.8 % (ref 11.5–15.5)
WBC: 20.7 10*3/uL — ABNORMAL HIGH (ref 4.0–10.5)
nRBC: 0 % (ref 0.0–0.2)

## 2022-05-04 LAB — BASIC METABOLIC PANEL
Anion gap: 8 (ref 5–15)
BUN: 6 mg/dL (ref 6–20)
CO2: 22 mmol/L (ref 22–32)
Calcium: 9.2 mg/dL (ref 8.9–10.3)
Chloride: 106 mmol/L (ref 98–111)
Creatinine, Ser: 0.77 mg/dL (ref 0.44–1.00)
GFR, Estimated: >60 mL/min (ref >60)
Glucose, Bld: 127 mg/dL — ABNORMAL HIGH (ref 70–99)
Potassium: 4.1 mmol/L (ref 3.5–5.1)
Sodium: 136 mmol/L (ref 135–145)

## 2022-05-04 LAB — RPR: RPR Ser Ql: NONREACTIVE

## 2022-05-04 MED ORDER — BENZOCAINE-MENTHOL 20-0.5 % EX AERO
1.0000 | INHALATION_SPRAY | CUTANEOUS | Status: DC | PRN
  Administered 2022-05-04: 1 via TOPICAL
  Filled 2022-05-04: qty 56

## 2022-05-04 MED ORDER — IBUPROFEN 600 MG PO TABS
600.0000 mg | ORAL_TABLET | Freq: Four times a day (QID) | ORAL | Status: DC
  Administered 2022-05-04 – 2022-05-05 (×6): 600 mg via ORAL
  Filled 2022-05-04 (×6): qty 1

## 2022-05-04 MED ORDER — SENNOSIDES-DOCUSATE SODIUM 8.6-50 MG PO TABS
2.0000 | ORAL_TABLET | Freq: Every day | ORAL | Status: DC
  Filled 2022-05-04: qty 2

## 2022-05-04 MED ORDER — DIPHENHYDRAMINE HCL 25 MG PO CAPS: 25.0000 mg | ORAL_CAPSULE | Freq: Four times a day (QID) | ORAL | Status: DC | PRN

## 2022-05-04 MED ORDER — TETANUS-DIPHTH-ACELL PERTUSSIS 5-2.5-18.5 LF-MCG/0.5 IM SUSY: 0.5000 mL | PREFILLED_SYRINGE | Freq: Once | INTRAMUSCULAR | Status: DC

## 2022-05-04 MED ORDER — ONDANSETRON HCL 4 MG/2ML IJ SOLN: 4.0000 mg | INTRAMUSCULAR | Status: DC | PRN

## 2022-05-04 MED ORDER — PRENATAL MULTIVITAMIN CH
1.0000 | ORAL_TABLET | Freq: Every day | ORAL | Status: DC
  Administered 2022-05-04 – 2022-05-05 (×2): 1 via ORAL
  Filled 2022-05-04 (×2): qty 1

## 2022-05-04 MED ORDER — SIMETHICONE 80 MG PO CHEW: 80.0000 mg | CHEWABLE_TABLET | ORAL | Status: DC | PRN

## 2022-05-04 MED ORDER — ACETAMINOPHEN 325 MG PO TABS: 650.0000 mg | ORAL_TABLET | ORAL | Status: DC | PRN

## 2022-05-04 MED ORDER — DIBUCAINE (PERIANAL) 1 % EX OINT: 1.0000 | TOPICAL_OINTMENT | CUTANEOUS | Status: DC | PRN

## 2022-05-04 MED ORDER — WITCH HAZEL-GLYCERIN EX PADS: 1.0000 | MEDICATED_PAD | CUTANEOUS | Status: DC | PRN

## 2022-05-04 MED ORDER — ONDANSETRON HCL 4 MG PO TABS: 4.0000 mg | ORAL_TABLET | ORAL | Status: DC | PRN

## 2022-05-04 MED ORDER — CARIPRAZINE HCL 1.5 MG PO CAPS
1.5000 mg | ORAL_CAPSULE | Freq: Every day | ORAL | Status: DC
  Filled 2022-05-04: qty 1

## 2022-05-04 MED ORDER — COCONUT OIL OIL: 1.0000 | TOPICAL_OIL | Status: DC | PRN

## 2022-05-04 NOTE — Lactation Note (Signed)
This note was copied from a baby's chart. Lactation Consultation Note  Patient Name: Debbie Schaefer [id number] Date: 05/04/2022 Reason for consult: Follow-up assessment;Primapara;1st time breastfeeding;Early term 37-38.6wks Age:20 hours  LC in to room for follow up. Birthing parent recently bottlefed formula. Latched was not attempted at this time. Reviewed hand expression and drops easily collected. Demonstrated hand pump and DEBP use, including frequency/use/care and milk storage. LC reinforced pacing when bottlefeeding, frequent burping and upright position as well as following appropriate volume per age with and without breastfeeding.   Reviewed normal newborn behavior during first 24h, expected output, tummy size and feeding frequency.   Plan:   1-Feeding on demand.  2-Pump as needed for stimulation/supplementation 3-If formula feeding, volume according to age, pace bottled feeding, frequent burping and upright position. 4-Encouraged birthing parent hydration, nutrition and rest.  Contact LC as needed for feeds/support/concerns/questions. All questions answered at this time. Local resources discussed.    Maternal Data Has patient been taught Hand Expression?: Yes Does the patient have breastfeeding experience prior to this delivery?: No  Feeding Mother's Current Feeding Choice: Breast Milk and Formula Nipple Type: Slow - flow  Lactation Tools Discussed/Used Tools: Pump;Flanges Flange Size: 21 (may need a 19-mm flange) Breast pump type: Double-Electric Breast Pump;Manual Pump Education: Setup, frequency, and cleaning;Milk Storage Reason for Pumping: stimulation and supplementation Pumping frequency: at least 8x24h Pumped volume:  (drops with initiation)  Interventions Interventions: Breast feeding basics reviewed;Skin to skin;Breast massage;Hand express;Hand pump;Expressed milk;DEBP;Education;Pace feeding  Discharge Discharge Education: Engorgement and breast care Pump:  Manual;DEBP WIC Program: Yes  Consult Status Consult Status: Follow-up Date: 05/05/22 Follow-up type: In-patient    Nevaeh Casillas A Higuera Ancidey 05/04/2022, 1:01 PM

## 2022-05-04 NOTE — Anesthesia Postprocedure Evaluation (Signed)
Anesthesia Post Note  Patient: Debbie Schaefer  Procedure(s) Performed: AN AD HOC LABOR EPIDURAL     Patient location during evaluation: Mother Baby Anesthesia Type: Epidural Level of consciousness: awake Pain management: satisfactory to patient Vital Signs Assessment: post-procedure vital signs reviewed and stable Respiratory status: spontaneous breathing Cardiovascular status: stable Anesthetic complications: no   No notable events documented.  Last Vitals:  Vitals:   05/04/22 1240 05/04/22 1325  BP: 138/82 (P) 129/83  Pulse: 98   Resp: 18   Temp: 36.9 C   SpO2:      Last Pain:  Vitals:   05/04/22 1240  TempSrc: Oral  PainSc: 0-No pain   Pain Goal:                   [employer]

## 2022-05-04 NOTE — Lactation Note (Signed)
This note was copied from a baby's chart. Lactation Consultation Note  Patient Name: Debbie Schaefer [id number] Date: 05/04/2022 Reason for consult: L&D Initial assessment;Primapara;Early term 37-38.6wks Age:20 hours Baby rooting, suckling on his top lip. LC attempt to break suck w/gloved finger. Attempt multiple times to latch abby to the breast. Baby eager but tongue thrust nipple out then sucks on his top lip. Most ly doesn't open wide when he did tongue thrust. Explained to mom the baby is learning and has never worked for food before. He will figure it out. Will f/u mom and baby on MBU.  Maternal Data Does the patient have breastfeeding experience prior to this delivery?: No  Feeding    LATCH Score Latch: Repeated attempts needed to sustain latch, nipple held in mouth throughout feeding, stimulation needed to elicit sucking reflex.  Audible Swallowing: None  Type of Nipple: Everted at rest and after stimulation (semi flat/very short shaft/compressible)  Comfort (Breast/Nipple): Soft / non-tender  Hold (Positioning): Full assist, staff holds infant at breast  LATCH Score: 5   Lactation Tools Discussed/Used    Interventions Interventions: Adjust position;Assisted with latch;Skin to skin;Breast massage;Breast compression  Discharge    Consult Status Consult Status: Follow-up from L&D Date: 05/04/22 Follow-up type: In-patient    Charyl Dancer 05/04/2022, 2:58 AM

## 2022-05-04 NOTE — Discharge Summary (Signed)
Postpartum Discharge Summary    Patient Name: Debbie Schaefer DOB: 2000-07-13 MRN: [id number]  Date of admission: 05/03/2022 Delivery date:05/04/2022  Delivering provider: Gerlene Fee  Date of discharge: 05/05/2022  Admitting diagnosis: Indication for care in labor and delivery, antepartum [O75.9] Intrauterine pregnancy: [redacted]w[redacted]d    Secondary diagnosis:  Principal Problem:   Indication for care in labor and delivery, antepartum Active Problems:   Vaginal delivery  Additional problems: None    Discharge diagnosis: Term Pregnancy Delivered                                              Post partum procedures: None Augmentation: N/A Complications: None  Hospital course: Onset of Labor With Vaginal Delivery      21y.o. yo G1P0 at 340w5das admitted in Latent Labor on 05/03/2022. Labor course was uncomplicated. Membrane Rupture Time/Date:  ,05/04/2022   Delivery Method:Vaginal, Spontaneous  Episiotomy: None  Lacerations:  Sulcus  Patient had a uncomplicated.  She is ambulating, tolerating a regular diet, passing flatus, and urinating well. Patient is discharged home in stable condition on 05/05/22.  Newborn Data: Birth date:05/04/2022  Birth time:1:55 AM  Gender:Female  Schaefer status:Schaefer  Apgars:7 ,8  Weight:3580 g   Magnesium Sulfate received: No BMZ received: No Rhophylac:No MMR:N/A T-DaP:Given prenatally Flu: No Transfusion:No  Physical exam  Vitals:   05/04/22 1700 05/04/22 2233 05/05/22 0546 05/05/22 1400  BP: 130/83 133/72 120/76 133/78  Pulse: 100 100 90 79  Resp: _0 Temp: 97.7 F (36.5 C) 98.6 F (37 C) 98.1 F (36.7 C)   TempSrc: Oral  Oral   SpO2: 100% 100% 99%   Weight:      Height:       General: alert, cooperative, and no distress Lochia: appropriate Uterine Fundus: firm Incision: N/A DVT Evaluation: No evidence of DVT seen on physical exam. Labs: Lab Results  Component Value Date   WBC 20.7 (H) 05/04/2022   HGB 12.9  05/04/2022   HCT 36.7 05/04/2022   MCV 89.5 05/04/2022   PLT 298 05/04/2022      Latest Ref Rng & Units 05/03/2022   11:07 PM  CMP  Glucose 70 - 99 mg/dL 127   BUN 6 - 20 mg/dL 6   Creatinine 0.44 - 1.00 mg/dL 0.77   Sodium 135 - 145 mmol/L 136   Potassium 3.5 - 5.1 mmol/L 4.1   Chloride 98 - 111 mmol/L 106   CO2 22 - 32 mmol/L 22   Calcium 8.9 - 10.3 mg/dL 9.2    Edinburgh Score:    05/04/2022    9:00 AM  Edinburgh Postnatal Depression Scale Screening Tool  I have been able to laugh and see the funny side of things. 0  I have looked forward with enjoyment to things. 0  I have blamed myself unnecessarily when things went wrong. 0  I have been anxious or worried for no good reason. 2  I have felt scared or panicky for no good reason. 2  Things have been getting on top of me. 1  I have been so unhappy that I have had difficulty sleeping. 0  I have felt sad or miserable. 0  I have been so unhappy that I have been crying. 0  The thought of harming myself has occurred to me. 0  EdFlavia Shipperostnatal  Depression Scale Total 5     After visit meds:  Allergies as of 05/05/2022       Reactions   Amoxicillin Rash   Azithromycin Rash   Penicillins Rash        Medication List     TAKE these medications    ProAir HFA 108 (90 Base) MCG/ACT inhaler Generic drug: albuterol Inhale 2 puffs into the lungs every 6 (six) hours as needed for wheezing or shortness of breath.   Vraylar 1.5 MG capsule Generic drug: cariprazine Take 1.5 mg by mouth daily.         Discharge home in stable condition Infant Feeding: Bottle and Breast Infant Disposition:home with mother Discharge instruction: per After Visit Summary and Postpartum booklet. Activity: Advance as tolerated. Pelvic rest for 6 weeks.  Diet: routine diet Future Appointments:No future appointments. Follow up Visit:  Follow up with [facility].    05/05/2022 Debbie Living, MD

## 2022-05-04 NOTE — Lactation Note (Signed)
This note was copied from a baby's chart. Lactation Consultation Note  Patient Name: Debbie Schaefer Date: 05/04/2022 Reason for consult: Initial assessment;Primapara;Early term 37-38.6wks Age:20 hours Baby is more alert and more interested in BF than suckling on his top lip for this feeding. Baby has latched well. Mom denies painful latch. Suggested football hold since mom has small short shaft nipples.  Colostrum noted. Praised mom for good BF. Mom excited. Discussed w/mom to see if she is interested in pumping mom agreed. But does want to wait until the morning after she has rested. Mom hasn't sleep since delivery and is tired. LC set up DEBP. Mom need #21 flanges. None on 5th floor. LC will send #21 flanges up from 4th floor.  Baby feeding well. Newborn feeding habits, STS, I&O, positioning, body alignment reviewed. Encouraged to call for assistance as needed.  Maternal Data Has patient been taught Hand Expression?: Yes Does the patient have breastfeeding experience prior to this delivery?: No  Feeding    LATCH Score Latch: Grasps breast easily, tongue down, lips flanged, rhythmical sucking.  Audible Swallowing: A few with stimulation  Type of Nipple: Everted at rest and after stimulation  Comfort (Breast/Nipple): Soft / non-tender  Hold (Positioning): Assistance needed to correctly position infant at breast and maintain latch.  LATCH Score: 8   Lactation Tools Discussed/Used Tools: Pump Breast pump type: Double-Electric Breast Pump Pump Education: Setup, frequency, and cleaning Reason for Pumping: short shaft nipples Pumping frequency: q3hr  Interventions Interventions: Breast feeding basics reviewed;Adjust position;DEBP;Assisted with latch;Support pillows;Skin to skin;Position options;Breast massage;Hand express;LC Services brochure;Pre-pump if needed;Breast compression  Discharge    Consult Status Consult Status: Follow-up Date: 05/04/22 Follow-up  type: In-patient    Charyl Dancer 05/04/2022, 5:06 AM

## 2022-05-05 MED ORDER — SUCROSE 24% NICU/PEDS ORAL SOLUTION: 0.5000 mL | OROMUCOSAL | Status: DC | PRN

## 2022-05-05 MED ORDER — EPINEPHRINE TOPICAL FOR CIRCUMCISION 0.1 MG/ML: 1.0000 [drp] | TOPICAL | Status: DC | PRN

## 2022-05-05 MED ORDER — CARIPRAZINE HCL 1.5 MG PO CAPS
1.5000 mg | ORAL_CAPSULE | Freq: Every day | ORAL | Status: DC
  Administered 2022-05-05: 1.5 mg via ORAL
  Filled 2022-05-05: qty 1

## 2022-05-05 MED ORDER — GELATIN ABSORBABLE 12-7 MM EX MISC: 1.0000 | Freq: Once | CUTANEOUS | Status: DC | PRN

## 2022-05-05 MED ORDER — LIDOCAINE 1% INJECTION FOR CIRCUMCISION
0.8000 mL | INJECTION | Freq: Once | INTRAVENOUS | Status: DC
  Filled 2022-05-05: qty 1

## 2022-05-05 MED ORDER — WHITE PETROLATUM EX OINT: 1.0000 | TOPICAL_OINTMENT | CUTANEOUS | Status: DC | PRN

## 2022-05-05 NOTE — Lactation Note (Deleted)
This note was copied from a baby's chart. Lactation Consultation Note  Patient Name: Debbie Schaefer [id number] Date: 05/05/2022 Reason for consult: Follow-up assessment;Difficult latch;Primapara;1st time breastfeeding;Early term 37-38.6wks;Breastfeeding assistance (1.82% WL) Age:20 hours  LC entered the room and the supporting parent was holding the infant.  Per the birth parent, they have been giving more bottles today due to the infant not latching.  She stated that the infant will latch and then spit out the nipple.  LC suggested trying a nipple shield at the next feeding to assist the infant with latching.  The birth parent also said that her breasts were starting to feel sore in the axillary region.  The birth parent has been pumping and asked about the effectiveness of the manual breast pump.  The MD entered the room and the consultation ended.  The birth parent was encouraged to call for lactation at the next feeding.   Feeding Mother's Current Feeding Choice: Breast Milk and Formula Nipple Type: Slow - flow  Interventions Interventions: Breast feeding basics reviewed;Education  Discharge    Consult Status Consult Status: Follow-up Date: 05/06/22 Follow-up type: In-patient    Debbie Schaefer 05/05/2022, 2:00 PM

## 2022-05-05 NOTE — Progress Notes (Signed)
Circumcision Consent  Discussed with mom at bedside about circumcision.   Circumcision is a surgery that removes the skin that covers the tip of the penis, called the "foreskin." Circumcision is usually done when a boy is between 1 and 10 days old, sometimes up to 3-4 weeks old.  The most common reasons boys are circumcised include for cultural/religious beliefs or for parental preference (potentially easier to clean, so baby looks like daddy, etc).  There may be some medical benefits for circumcision:   Circumcised boys seem to have slightly lower rates of: ? Urinary tract infections (per the American Academy of Pediatrics an uncircumcised boy has a 1/100 chance of developing a UTI in the first year of life, a circumcised boy at a 06/998 chance of developing a UTI in the first year of life- a 10% reduction) ? Penis cancer (typically rare- an uncircumcised female has a 1 in 100,000 chance of developing cancer of the penis) ? Sexually transmitted infection (in endemic areas, including HIV, HPV and Herpes- circumcision does NOT protect against gonorrhea, chlamydia, trachomatis, or syphilis) ? Phimosis: a condition where that makes retraction of the foreskin over the glans impossible (0.4 per 1000 boys per year or 0.6% of boys are affected by their 15th birthday)  Boys and men who are not circumcised can reduce these extra risks by: ? Cleaning their penis well ? Using condoms during sex  What are the risks of circumcision?  As with any surgical procedure, there are risks and complications. In circumcision, complications are rare and usually minor, the most common being: ? Bleeding- risk is reduced by holding each clamp for 30 seconds prior to a cut being made, and by holding pressure after the procedure is done ? Infection- the penis is cleaned prior to the procedure, and the procedure is done under sterile technique ? Damage to the urethra or amputation of the penis  How is circumcision done  in baby boys?  The baby will be placed on a special table and the legs restrained for their safety. Numbing medication is injected into the penis, and the skin is cleansed with betadine to decrease the risk of infection.   What to expect:  The penis will look red and raw for 5-7 days as it heals. We expect scabbing around where the cut was made, as well as clear-pink fluid and some swelling of the penis right after the procedure. If your baby's circumcision starts to bleed or develops pus, please contact your pediatrician immediately.  All questions were answered and mother consented.  Lurene Robley V Ronette Hank, MD 11:55 AM  

## 2022-05-05 NOTE — Lactation Note (Signed)
This note was copied from a baby's chart. Lactation Consultation Note  Patient Name: Debbie Schaefer Date: 05/05/2022 Reason for consult: Follow-up assessment;Primapara;1st time breastfeeding;Early term 37-38.6wks;Breastfeeding assistance;Infant weight loss (1.82% WL) Age:20 hours  LC entered the room and the supporting parent was holding the infant.  Per the birth parent, they have been giving more bottled today due to the infant not latching.  She stated that the infant will latch, then spit the nipple out.  LC suggested trying a nipple shield at the next feeding to assist with latching.  The birth parent stated that she was having some soreness in her axillary region.  The birth parent has been pumping with the DEBP and asked about the effectiveness of the manual breast pump.  The birth parent was encouraged to call lactation at the next feeding.  Consult Status  Consult Status: Follow-up  Date: 05/06/22 Follow-up Type: In patient   Feeding Mother's Current Feeding Choice: Breast Milk and Formula  Interventions Interventions: Breast feeding basics reviewed;Education  Discharge Discharge Education: Engorgement and breast care;Warning signs for feeding baby;Outpatient recommendation   Delene Loll 05/05/2022, 6:11 PM

## 2022-05-05 NOTE — Progress Notes (Signed)
Pt requests early discharge

## 2022-05-05 NOTE — Clinical Social Work Maternal (Addendum)
CLINICAL SOCIAL WORK MATERNAL/CHILD NOTE  Patient Details  Name: Debbie Schaefer MRN: [id number] Date of Birth: 02/26/2001  Date:  January 17, 2022  Clinical Social Worker Initiating Note:  Idamae Lusher, LCSWA Date/Time: Initiated:  05/05/22/1243     Child's Name:  Gracy Bruins   Biological Parents:  Mother, Father (FOB: Mickeal Skinner, DOB: 03/27/1999)   Need for Interpreter:  None   Reason for Referral:  Behavioral Health Concerns   Address:  [address] [place] [address]    Phone number:  [phone] (home)     Additional phone number:   Household Members/Support Persons (HM/SP):   Household Member/Support Person 1, Household Member/Support Person 2   HM/SP Name Relationship DOB or Age  HM/SP -36 Terri Office manager Mother    HM/SP -2 Domonic Office manager Step-father    HM/SP -3        HM/SP -4        HM/SP -5        HM/SP -6        HM/SP -7        HM/SP -8          Natural Supports (not living in the home):  Spouse/significant other, Extended Family, Immediate Family   Professional Supports:     Employment: Animator   Type of Work: Optician, 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 [place]   Education:  [place] arranged:    Museum/gallery curator Resources:  Medicaid   Other Resources:  [facility]   Cultural/Religious Considerations Which May Impact Care:  None identified  Strengths:  Psychotropic Medications   Psychotropic Medications:  Other meds Arman Filter)      Pediatrician:     [facility]  Pediatrician List:   [place] [facility] [place] [facility]  [place]      Pediatrician Fax Number:    Risk Factors/Current Problems:  Mental Health Concerns     Cognitive State:  Alert  , Linear Thinking  , Goal Oriented  , Insightful  , Able to Concentrate     Mood/Affect:  Relaxed  ,  Calm  , Interested  , Comfortable     CSW Assessment: CSW was consulted due to MOB's diagnoses of Bipolar Disorder, Depression, Anxiety, and a history of SI with a past suicide attempt. CSW met with MOB at bedside to complete assessment. When CSW entered room, MOB was in restroom and had visitors present (FOB and friend). Infant was receiving circumcision. CSW introduced self and requested to speak with MOB alone. MOB was agreeable and visitors left. CSW explained reason for consult. MOB presented as calm, was agreeable to consult and remained engaged throughout encounter.   CSW inquired how MOB has felt emotionally since giving birth. MOB shares she had an amazing delivery but has experienced a lot of anxiety since infant "Lashun" has been born. MOB shares she had a miscarriage during her last pregnancy (1/23) and feels worried about infant's health. CSW provided emotional support.   MOB shares she believes that she doesn't feel like herself because she has not taken her Vraylar (1.5 mg/day, takes in mornings) since 12-06-21 due to accidentally leaving the medication at home. CSW offered to notify MOB's RN and provider to order the medication, MOB expressed appreciation. RN and MOB's OBGYN updated  and notified.  CSW inquired about MOB's mental health history. MOB reports she was diagnosed with Bipolar Disorder (unsure of subtype) in 2019, marked by manic episodes and "emotional/anger outbursts" and diagnosed with depression and anxiety in 2021. MOB reports her last manic episode was 06/2021 after she miscarried, sharing she attempted suicide by attempting to throw herself off of her balcony at her college. MOB shares her boyfriend/FOB stopped her and she was prescribed Vraylar shortly after which has successfully managed her mental health symptoms. Per chart review, MOB has 2 other prior suicide attempts (one attempt in 2019 and one attempt in 6th grade) both via intentional drug overdoses. MOB denies  endorsing SI since suicide attempt 1/23. CSW inquired about auditory hallucinations (per chart review). MOB reports a history of auditory hallucinations, marked by hearing loud, scary voices. MOB shares she does not experience AH when current on Vraylar. MOB denies a history of command hallucinations and denies a history of visual hallucinations. MOB reports she last endorsed auditory hallucinations 1 month ago after she ran out of her Vraylar prescription for 2 days. MOB reports the AH subsided after she resumed Vraylar. MOB reports prior to this episode, she had last endorsed AH 1 year ago prior to starting Vraylar. MOB denies endorsing auditory or visual hallucinations since giving birth. MOB denies current SI/HI/DV.  MOB reports she was prescribed escitalopram in the past but did shares the medication did not work well. MOB denies experiencing depression, anxiety, manic symptoms, or emotional outbursts since beginning [place] 1 year ago. MOB is prescribed through her PCP ([place]) and has 3 refills.  MOB is not current with a therapist. MOB was agreeable to crisis and outpatient therapy resources. MOB identified FOB, her mother, step-father, best friend, and brothers and sisters as supports. MOB shares she feels comfortable talking to her best friend about mental health concerns. CSW provided psycho-education about the importance of sleep and medication compliance postpartum and reviewed signs and symptoms of PMADs, particularly postpartum psychosis and provided contact information for Behavioral Health Urgent Care. MOB verbalized understanding. MOB identified feeling overstimulated as a trigger and shared going somewhere quiet is helpful. CSW and MOB discussed coping skills to manage mental health symptoms postpartum.  CSW reviewed home visiting programs and inquired about resource needs. MOB declined interest in visiting programs. MOB receives [facility] and expressed interest in food stamps. CSW  provided Food Stamps online application link via text. MOB reports she has all needed items for infant, including a car seat and bassinet. MOB has chosen [facility] for infant's follow up care.  CSW provided education regarding the baby blues period vs. perinatal mood disorders, discussed treatment and gave resources for mental health follow up if concerns arise. CSW recommends self-evaluation during the postpartum time period using the New Mom Checklist from Postpartum Progress and encouraged MOB to contact a medical professional if symptoms are noted at any time.    CSW provided review of Sudden Infant Death Syndrome (SIDS) precautions.    CSW identifies no further need for intervention and no barriers to discharge at this time.  CSW Plan/Description:  No Further Intervention Required/No Barriers to Discharge, Sudden Infant Death Syndrome (SIDS) Education, Perinatal Mood and Anxiety Disorder (PMADs) Education, Other Information/Referral to [place], [place] 05/05/2022, 12:48 PM

## 2022-05-05 NOTE — Lactation Note (Signed)
This note was copied from a baby's chart. Lactation Consultation Note  Patient Name: Debbie Schaefer [id number] Date: 05/05/2022 Reason for consult: Follow-up assessment;Primapara;1st time breastfeeding;Early term 37-38.6wks;Breastfeeding assistance;Infant weight loss (1.82% WL) Age:20 hours  LC entered the room and the supporting parent was holding the infant.  Per the birth parent, she had no questions or concerns.  LC reviewed engorgement, mastitis, warning signs, infant I/O, and outpatient lactation services.  The birth parent asked about drying up her milk if she decides not to breastfeed.  She was instructed to avoid stimulation and use cabbage leaves.  All questions were answered.  LC congratulated the parents and exited the room.   Infant Feeding Plan:  Breastfeed 8+ times in 24 hours according to feeding cues.  Put the infant to the breast prior to supplementing.  Supplement according to supplementation guidelines.  Watch infant output and call the pediatrician with concerns.  Call outpatient lactation consultant for assistance with breastfeeding.   Feeding Mother's Current Feeding Choice: Breast Milk and Formula   Interventions Interventions: Breast feeding basics reviewed;Education  Discharge Discharge Education: Engorgement and breast care;Warning signs for feeding baby;Outpatient recommendation  Consult Status Consult Status: Complete Date: 05/05/22 Follow-up type: Call as needed    Delene Loll 05/05/2022, 5:58 PM

## 2022-05-12 ENCOUNTER — Telehealth (HOSPITAL_COMMUNITY): Payer: Self-pay

## 2022-05-12 NOTE — Telephone Encounter (Signed)
Patient did not answer phone call. Voicemail left for patient.   Marcelino Duster [facility] 05/12/2022,0954

## 2022-05-23 ENCOUNTER — Telehealth: Payer: Self-pay | Admitting: Family Medicine

## 2022-05-23 DIAGNOSIS — F53 Postpartum depression: Secondary | ICD-10-CM

## 2022-05-23 NOTE — Telephone Encounter (Signed)
Patients connect nurse called in saying she had an [place] score of 12 and that the patient was open to behavioral health services.

## 2022-05-24 NOTE — Telephone Encounter (Signed)
Returned patient phone call regarding elevated [place]. Patient is currently scheduled to meet with J. McMannes LCSW on 12/19 at 1415. Pt was not aware of the appointment. However, she states she will be able to make it. I explained to her how to log on to MyChart for virtual visit. Pt verbalized understanding and denied further questions.

## 2022-05-28 NOTE — BH Specialist Note (Unsigned)
Integrated Behavioral Health via Telemedicine Visit  05/29/2022 Debbie Schaefer [id number]  Number of Integrated Behavioral Health Clinician visits: 1- Initial Visit  Session Start time: 1419   Session End time: 1454  Total time in minutes: 35   Referring Provider: Catalina Antigua, MD Patient/Family location: Home [facility] Provider [facility]  All persons participating in visit: Patient Debbie Schaefer and [facility] Zachory Mangual   Types of Service: Individual psychotherapy and Video visit  I connected with Tanairy Lazarus Salines and/or Pinki Jade Basto's  n/a  via  Telephone or Video Enabled Telemedicine Application  (Video is Caregility application) and verified that I am speaking with the correct person using two identifiers. Discussed confidentiality: Yes   I discussed the limitations of telemedicine and the availability of in person appointments.  Discussed there is a possibility of technology failure and discussed alternative modes of communication if that failure occurs.  I discussed that engaging in this telemedicine visit, they consent to the provision of behavioral healthcare and the services will be billed under their insurance.  Patient and/or legal guardian expressed understanding and consented to Telemedicine visit: Yes   Presenting Concerns: Patient and/or family reports the following symptoms/concerns: Increased anxiety postpartum, attributes to previous miscarriage keeping her on alert with newborn to make sure he's okay. Pt is taking Seroquel, Clonazepam and Vraylar as prescribed by her ob/gyn to treat bipolar disorder with anxiety; used to vape to manage anxiety; open to implementing healthy self-coping strategy.  Duration of problem: Ongoing with increased anxiety postpartum; Severity of problem:  moderately severe  Patient and/or Family's Strengths/Protective Factors: Concrete supports in place (healthy food, safe  environments, etc.), Sense of purpose, and Physical Health (exercise, healthy diet, medication compliance, etc.)  Goals Addressed: Patient will:  Reduce symptoms of: anxiety, depression, and mood instability   Increase knowledge and/or ability of: self-management skills   Demonstrate ability to: Increase healthy adjustment to current life circumstances, Increase adequate support systems for patient/family, and Increase motivation to adhere to plan of care  Progress towards Goals: Ongoing  Interventions: Interventions utilized:  Mindfulness or Management consultant, Psychoeducation and/or Health Education, and Link to [employer] Standardized Assessments completed: GAD-7 and PHQ 9  Patient and/or Family Response: Patient agrees with treatment plan.   Assessment: Patient currently experiencing Bipolar affective disorder and Anxiety disorder .   Patient may benefit from psychoeducation and brief therapeutic interventions regarding coping with symptoms of anxiety  .  Plan: Follow up with behavioral health clinician on : Two weeks Behavioral recommendations:  -Continue to take [facility] medications as prescribed -Continue plan to attend upcoming ob/gyn appointment on 06/12/2022 -CALM relaxation breathing exercise twice daily (morning; at bedtime); as needed throughout the day. -Continue prioritizing healthy self-care (regular meals, adequate rest; allowing practical help from supportive friends and family) until at least postpartum medical appointment -Consider new mom support group as needed at either www.[email or web address].net or [email or web address]    Referral(s): Integrated Art gallery manager (In Clinic) and [employer]:  new parent support  I discussed the assessment and treatment plan with the patient and/or parent/guardian. They were provided an opportunity to ask questions and all were answered. They agreed with the plan and demonstrated an understanding of the  instructions.   They were advised to call back or seek an in-person evaluation if the symptoms worsen or if the condition fails to improve as anticipated.  Rae Lips, LCSW     05/29/2022    2:25 PM  Depression screen PHQ 2/9  Decreased Interest 0  Down, Depressed, Hopeless 1  PHQ - 2 Score 1  Altered sleeping 3  Tired, decreased energy 3  Change in appetite 0  Feeling bad or failure about yourself  0  Trouble concentrating 0  Moving slowly or fidgety/restless 0  Suicidal thoughts 0  PHQ-9 Score 7      05/29/2022    2:29 PM  GAD 7 : Generalized Anxiety Score  Nervous, Anxious, on Edge 3  Control/stop worrying 3  Worry too much - different things 3  Trouble relaxing 0  Restless 1  Easily annoyed or irritable 3  Afraid - awful might happen 3  Total GAD 7 Score 16

## 2022-05-29 ENCOUNTER — Ambulatory Visit (INDEPENDENT_AMBULATORY_CARE_PROVIDER_SITE_OTHER): Payer: Medicaid Other | Admitting: Clinical

## 2022-05-29 DIAGNOSIS — F419 Anxiety disorder, unspecified: Secondary | ICD-10-CM

## 2022-05-29 DIAGNOSIS — F3170 Bipolar disorder, currently in remission, most recent episode unspecified: Secondary | ICD-10-CM

## 2022-05-29 NOTE — Patient Instructions (Signed)
[facility] [address] [place], [place] [address] [phone] (main office) [phone] [facility] office)  New Parent Support Groups [email or web address] [email or web address]  [facility]  [address], [place], [place] [address] [phone] or [phone] WALK-IN URGENT CARE 24/7 FOR ANYONE regardless of county of residence or insurance [address] [place], [place]  [phone] Fax: [phone] [email or web address] *Interpreters available *Accepts all insurance and uninsured for Urgent Care needs *Accepts Medicaid and uninsured for outpatient treatment (below)

## 2022-05-30 NOTE — BH Specialist Note (Signed)
Integrated Behavioral Health via Telemedicine Visit  05/30/2022 Zanae Kuehnle [id number]  Number of Integrated Behavioral Health Clinician visits: 1- Initial Visit  Session Start time: 1419   Session End time: 1454  Total time in minutes: 35   Referring Provider: Catalina Antigua, MD Patient/Family location: Home [facility] Provider location: Center for [facility]  All persons participating in visit: Patient Debbie Schaefer and [facility] Chaunda Vandergriff   Types of Service: Individual psychotherapy and Video visit  I connected with Bryley Lazarus Salines and/or Shwanda Jade Kleven's  n/a  via  Telephone or Video Enabled Telemedicine Application  (Video is Caregility application) and verified that I am speaking with the correct person using two identifiers. Discussed confidentiality: Yes   I discussed the limitations of telemedicine and the availability of in person appointments.  Discussed there is a possibility of technology failure and discussed alternative modes of communication if that failure occurs.  I discussed that engaging in this telemedicine visit, they consent to the provision of behavioral healthcare and the services will be billed under their insurance.  Patient and/or legal guardian expressed understanding and consented to Telemedicine visit: Yes   Presenting Concerns: Patient and/or family reports the following symptoms/concerns: Mild concern about finding childcare; adjusting well to demands of new motherhood and looks forward to starting new job on 06/24/22.  Duration of problem: Ongoing; Severity of problem: mild  Patient and/or Family's Strengths/Protective Factors: Concrete supports in place (healthy food, safe environments, etc.), Sense of purpose, and Physical Health (exercise, healthy diet, medication compliance, etc.)  Goals Addressed: Patient will:  Maintain reduction of symptoms of: anxiety, depression, and mood instability     Demonstrate ability to: Increase healthy adjustment to current life circumstances  Progress towards Goals: Ongoing  Interventions: Interventions utilized:  Link to [employer] and Supportive Reflection Standardized Assessments completed: GAD-7 and PHQ 9  Patient and/or Family Response: Patient agrees with treatment plan.   Assessment: Patient currently experiencing Bipolar affective disorder and Anxiety disorder (both as previously diagnosed)  Patient may benefit from continued therapeutic intervention today.  Plan: Follow up with behavioral health clinician on : Call Madelon Welsch at [phone], as needed. Behavioral recommendations:  -Continue taking BH medication as prescribed  -Continue using self-coping strategies to help manage emotions (relaxation breathing exercises, healthy self-care daily; forward thinking) -Continue childcare search (Childcare resources on After Visit Summary; use as needed) -Continue to consider new mom support group as needed online at www.[email or web address].net Referral(s): Integrated Art gallery manager (In Clinic) and [employer] Resources:  Childcare; New mom support  I discussed the assessment and treatment plan with the patient and/or parent/guardian. They were provided an opportunity to ask questions and all were answered. They agreed with the plan and demonstrated an understanding of the instructions.   They were advised to call back or seek an in-person evaluation if the symptoms worsen or if the condition fails to improve as anticipated.  Valetta Close Phillippe Orlick, LCSW     06/13/2022    1:24 PM 05/29/2022    2:25 PM  Depression screen PHQ 2/9  Decreased Interest 0 0  Down, Depressed, Hopeless 0 1  PHQ - 2 Score 0 1  Altered sleeping 0 3  Tired, decreased energy 0 3  Change in appetite 0 0  Feeling bad or failure about yourself  0 0  Trouble concentrating 0 0  Moving slowly or fidgety/restless 0 0  Suicidal thoughts 0 0  PHQ-9 Score 0 7  06/13/2022    1:23 PM 05/29/2022    2:29 PM  GAD 7 : Generalized Anxiety Score  Nervous, Anxious, on Edge 1 3  Control/stop worrying 0 3  Worry too much - different things 0 3  Trouble relaxing 0 0  Restless 1 1  Easily annoyed or irritable 0 3  Afraid - awful might happen 1 3  Total GAD 7 Score 3 16

## 2022-06-13 ENCOUNTER — Ambulatory Visit (INDEPENDENT_AMBULATORY_CARE_PROVIDER_SITE_OTHER): Payer: Medicaid Other | Admitting: Clinical

## 2022-06-13 DIAGNOSIS — F419 Anxiety disorder, unspecified: Secondary | ICD-10-CM

## 2022-06-13 DIAGNOSIS — F3170 Bipolar disorder, currently in remission, most recent episode unspecified: Secondary | ICD-10-CM

## 2022-06-13 NOTE — Patient Instructions (Signed)
[facility] [address] [place], [place] [address] [phone] (main office) [phone] (Bali Lyn's office)  [place] [employer]  (Childcare options, Early childcare development, etc.) www.[place]child[place].org  [facility] Search Engine  https://ncchildcare.ncdhhs.gov/childcaresearch  New Parent Support Groups [email or web address].net [email or web address]  

## 2022-06-26 ENCOUNTER — Ambulatory Visit (INDEPENDENT_AMBULATORY_CARE_PROVIDER_SITE_OTHER): Payer: Medicaid Other | Admitting: Advanced Practice Midwife

## 2022-06-26 ENCOUNTER — Encounter: Payer: Self-pay | Admitting: Advanced Practice Midwife

## 2022-06-26 NOTE — Progress Notes (Signed)
Post Partum Visit Note  Debbie Schaefer is a 22 y.o. G47P1001 female who presents for a postpartum visit. She is 7 weeks postpartum following a normal spontaneous vaginal delivery.  I have fully reviewed the prenatal and intrapartum course. The delivery was at 38.5 gestational weeks.  Anesthesia: epidural. Postpartum course has been uncomplicated. Baby is doing well. Baby is feeding by bottle - Similac Sensitive RS. Bleeding no bleeding. Bowel function is normal. Bladder function is normal. Patient is not sexually active. Contraception method is IUD. Postpartum depression screening: negative.   Patient reports that she has dull, sharp pain in her perineum. She was wondering if it was from the stiches.   Patient feels like her recovery has gone smoothly. She is back at work and that is going well.   Patient had appt with PCP about a week ago, and they put in a skyla IUD at that visit. She has an appt in one month for string check. She would like to return here for pap smear so we will schedule her to return in one month for string check and pap here.   The pregnancy intention screening data noted above was reviewed. Potential methods of contraception were discussed. The patient elected to proceed with No data recorded.    Health Maintenance Due  Topic Date Due   COVID-19 Vaccine (1) Never done   HPV VACCINES (1 - 2-dose series) Never done   Hepatitis C Screening  Never done   DTaP/Tdap/Td (1 - Tdap) Never done   INFLUENZA VACCINE  01/09/2022    The following portions of the patient's history were reviewed and updated as appropriate: allergies, current medications, past family history, past medical history, past social history, past surgical history, and problem list.  Review of Systems Pertinent items are noted in HPI.  Objective:  LMP 04/19/2021 (Exact Date)   Physical Exam Vitals and nursing note reviewed. Exam conducted with a chaperone present.  Constitutional:      General:  She is not in acute distress. HENT:     Head: Normocephalic.  Cardiovascular:     Rate and Rhythm: Normal rate.  Pulmonary:     Effort: Pulmonary effort is normal.  Abdominal:     Palpations: Abdomen is soft.  Genitourinary:    Comments: Perineum is well healed. Some mild redness near clitoris where patient feels some pain right after urination. Reassured that this is likely just a small area healing and that it should resolve soon.  Skin:    General: Skin is warm and dry.  Neurological:     Mental Status: She is alert and oriented to person, place, and time.  Psychiatric:        Mood and Affect: Mood normal.        Behavior: Behavior normal.       Edinburgh Postnatal Depression Scale - 06/26/22 1338       Edinburgh Postnatal Depression Scale:  In the Past 7 Days   I have been able to laugh and see the funny side of things. 0    I have looked forward with enjoyment to things. 0    I have blamed myself unnecessarily when things went wrong. 0    I have been anxious or worried for no good reason. 0    I have felt scared or panicky for no good reason. 0    Things have been getting on top of me. 1    I have been so unhappy that I  have had difficulty sleeping. 0    I have felt sad or miserable. 0    I have been so unhappy that I have been crying. 0    The thought of harming myself has occurred to me. 0    Edinburgh Postnatal Depression Scale Total 1              Assessment:    1. Postpartum care and examination      Plan:   Essential components of care per ACOG recommendations:  1.  Mood and well being: Patient with negative depression screening today. Reviewed local resources for support. Patient on meds and seeing counselor. Feels like moods are good and has a good plan.  - Patient tobacco use? No.   - hx of drug use? No.    2. Infant care and feeding:  -Patient currently breastmilk feeding? No.  -Social determinants of health (SDOH) reviewed in EPIC. No  concerns.   3. Sexuality, contraception and birth spacing - Patient does not want a pregnancy in the next year.  Desired family size is 2 children.  - Reviewed reproductive life planning. Reviewed contraceptive methods based on pt preferences and effectiveness.  Patient desired IUD or IUS today.   - Discussed birth spacing of 18 months  4. Sleep and fatigue -Encouraged family/partner/community support of 4 hrs of uninterrupted sleep to help with mood and fatigue  5. Physical Recovery  - Discussed patients delivery and complications. She describes her labor as good. - Patient had a Vaginal, no problems at delivery. Patient had a 2nd degree laceration. Perineal healing reviewed. Patient expressed understanding - Patient has urinary incontinence? No. - Patient is safe to resume physical and sexual activity  6.  Health Maintenance - HM due items addressed Yes - Last pap smear: patient not 22 yet, due for pap in one month. Will have her return for string check and pap.  -Breast Cancer screening indicated? No.   7. Chronic Disease/Pregnancy Condition follow up: None  - PCP follow up   [place], CNM  06/26/22  2:01 PM

## 2022-08-02 ENCOUNTER — Ambulatory Visit: Payer: Medicaid Other | Admitting: Obstetrics and Gynecology
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
